# Patient Record
Sex: Female | Born: 1995 | Race: Black or African American | Hispanic: No | Marital: Single | Smoking: Current every day smoker
Health system: Southern US, Community
[De-identification: ages and names within clinical notes are randomized; demographics above are authoritative.]

---
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
## 2017-10-27 ENCOUNTER — Encounter: Payer: Self-pay | Admitting: Emergency Medicine

## 2017-10-27 ENCOUNTER — Emergency Department
Admission: EM | Admit: 2017-10-27 | Discharge: 2017-10-27 | Disposition: A | Discharge status: Home / Self Care | Payer: BLUE CROSS/BLUE SHIELD | Attending: Emergency Medicine | Admitting: Emergency Medicine

## 2017-10-27 ENCOUNTER — Other Ambulatory Visit: Payer: Self-pay

## 2017-10-27 DIAGNOSIS — Z139 Encounter for screening, unspecified: Secondary | ICD-10-CM | POA: Insufficient documentation

## 2017-10-27 DIAGNOSIS — F1010 Alcohol abuse, uncomplicated: Secondary | ICD-10-CM | POA: Diagnosis not present

## 2017-10-27 DIAGNOSIS — Z0289 Encounter for other administrative examinations: Secondary | ICD-10-CM | POA: Diagnosis present

## 2017-10-27 NOTE — ED Provider Notes (Signed)
[facility] Emergency Department Provider Note  ____________________________________________  Time seen: Approximately 7:23 PM  I have reviewed the triage vital signs and the nursing notes.   HISTORY  Chief Complaint Medical Clearance    HPI Maria Byrd is a 22 y.o. female with no significant past medical history who reports being instructed to come to the ED for medical clearance prior to proceeding with alcohol abuse treatment at [facility].  Patient reports that she drinks about half a bottle of liquor a day.  If she does not drink for a few days she feels anxious and flushed and a little sweaty, but denies any history of seizures or hallucinations ever.  Currently she feels fine.  Last drink was 2 days ago.  No acute complaints at this time.  Denies chest pain shortness of breath vision changes or agitation.  Denies smoking or any drug use.      History reviewed. No pertinent past medical history.   There are no active problems to display for this patient.    History reviewed. No pertinent surgical history.   Prior to Admission medications   Medication Sig Start Date End Date Taking? Authorizing Provider  meloxicam (MOBIC) 15 MG tablet Take 1 tablet (15 mg total) by mouth daily. 10/25/16   Cuthriell, Delorise Royals, PA-C     Allergies Patient has no known allergies.   No family history on file.  Social History Social History   Tobacco Use  . Smoking status: Never Smoker  . Smokeless tobacco: Never Used  Substance Use Topics  . Alcohol use: Yes  . Drug use: No    Review of Systems  Constitutional:   No fever or chills.   Cardiovascular:   No chest pain or syncope. Respiratory:   No dyspnea or cough. Gastrointestinal:   Negative for abdominal pain, vomiting and diarrhea.  Musculoskeletal:   Negative for focal pain or swelling All other systems reviewed and are negative except as documented above in ROS and  HPI.  ____________________________________________   PHYSICAL EXAM:  VITAL SIGNS: ED Triage Vitals  Enc Vitals Group     BP 10/27/17 1806 111/70     Pulse Rate 10/27/17 1806 81     Resp 10/27/17 1806 16     Temp 10/27/17 1806 98.8 F (37.1 C)     Temp Source 10/27/17 1806 Oral     SpO2 10/27/17 1806 100 %     Weight 10/27/17 1807 138 lb (62.6 kg)     Height 10/27/17 1807 5\' 3"  (1.6 m)     Head Circumference --      Peak Flow --      Pain Score 10/27/17 1807 0     Pain Loc --      Pain Edu? --      Excl. in GC? --     Vital signs reviewed, nursing assessments reviewed.   Constitutional:   Alert and oriented. Non-toxic appearance. Eyes:   Conjunctivae are normal. EOMI.  ENT      Head:   Normocephalic and atraumatic.      Nose:   No congestion/rhinnorhea.       Mouth/Throat:   MMM, no pharyngeal erythema. No peritonsillar mass.       Neck:   No meningismus. Full ROM. Hematological/Lymphatic/Immunilogical:   No cervical lymphadenopathy. Cardiovascular:   RRR. Symmetric bilateral radial and DP pulses.  No murmurs. Cap refill less than 2 seconds. Respiratory:   Normal respiratory effort without tachypnea/retractions. Breath sounds are  clear and equal bilaterally. No wheezes/rales/rhonchi. Gastrointestinal:   Soft and nontender. Non distended. There is no CVA tenderness.  No rebound, rigidity, or guarding.  Musculoskeletal:   Normal range of motion in all extremities. No joint effusions.  No lower extremity tenderness.  No edema. Neurologic:   Normal speech and language.  Motor grossly intact. No acute focal neurologic deficits are appreciated.  Skin:    Skin is warm and dry ____________________________________________    LABS (pertinent positives/negatives) (all labs ordered are listed, but only abnormal results are displayed) Labs Reviewed - No data to display ____________________________________________   EKG    ____________________________________________     RADIOLOGY  No results found.  ____________________________________________   PROCEDURES Procedures  ____________________________________________    CLINICAL IMPRESSION / ASSESSMENT AND PLAN / ED COURSE  Pertinent labs & imaging results that were available during my care of the patient were reviewed by me and considered in my medical decision making (see chart for details).    Patient with a history of alcohol abuse presents for medical screening.  No acute complaints.  No signs or symptoms of withdrawal at this time.  Vital signs are normal.  Advised that she can proceed with substance abuse treatment.      ____________________________________________   FINAL CLINICAL IMPRESSION(S) / ED DIAGNOSES    Final diagnoses:  Alcohol abuse  Encounter for medical screening examination     ED Discharge Orders    None      Portions of this note were generated with dragon dictation software. Dictation errors may occur despite best attempts at proofreading.    Sharman CheekStafford, Tisa Weisel, MD 10/27/17 [phone]

## 2017-10-27 NOTE — ED Triage Notes (Signed)
Pt to ED via POV. Pt states that she needs medical clearance so that she can go to [facility] for alcohol dextox. Pt states that her last drink was 2 days ago. Pt denies hx/o seizures from alcohol withdrawal. Pt is in NAD at this time.

## 2017-10-27 NOTE — Discharge Instructions (Addendum)
You had no acute complaints today, and your physical exam was unremarkable.  You are medically stable and cleared to proceed with substance abuse evaluation and treatment.

## 2017-10-27 NOTE — ED Notes (Signed)
Pt. Going home with mother in waiting room.

## 2018-12-02 ENCOUNTER — Ambulatory Visit (INDEPENDENT_AMBULATORY_CARE_PROVIDER_SITE_OTHER): Payer: Managed Care, Other (non HMO)

## 2018-12-02 ENCOUNTER — Ambulatory Visit
Admission: EM | Admit: 2018-12-02 | Discharge: 2018-12-02 | Disposition: A | Discharge status: Home / Self Care | Payer: Managed Care, Other (non HMO) | Attending: Family Medicine | Admitting: Family Medicine

## 2018-12-02 DIAGNOSIS — M25561 Pain in right knee: Secondary | ICD-10-CM | POA: Diagnosis not present

## 2018-12-02 DIAGNOSIS — W108XXA Fall (on) (from) other stairs and steps, initial encounter: Secondary | ICD-10-CM

## 2018-12-02 DIAGNOSIS — M25461 Effusion, right knee: Secondary | ICD-10-CM

## 2018-12-02 MED ORDER — MELOXICAM 15 MG PO TABS: 15.0000 mg | ORAL_TABLET | Freq: Every day | ORAL | 0 refills | Status: DC | PRN

## 2018-12-02 NOTE — ED Triage Notes (Signed)
Pt states she fell 2 days ago, missed some stairs and landed on her right knee on the concrete. States it is red, swollen and did take ibuprofen without relief.

## 2018-12-02 NOTE — ED Provider Notes (Signed)
[facility] ____________________________________________  Time seen: Approximately 9:09 AM  I have reviewed the triage vital signs and the nursing notes.   HISTORY  Chief Complaint Fall   HPI Maria Byrd is a 23 y.o. female presenting for evaluation of right knee pain post injury that occurred 2 days ago.  Patient reports she missed the bottom step outside causing her to fall directly on her right knee.  Reports pain and swelling since.  Difficulty walking but able to weight-bear.  Occasionally feels like knee is getting give out on her.  No other injury.  Has tried Aleve and ice without resolution.  Difficulty going to work due to a lot of walking required at work.  No issues to the same area previously.  Denies other aggravating or alleviating factors.  No paresthesias or pain radiation.  No LMP recorded. (Menstrual status: IUD).  Denies pregnancy   History reviewed. No pertinent past medical history. Denies   There are no active problems to display for this patient.   History reviewed. No pertinent surgical history.   No current facility-administered medications for this encounter.   Current Outpatient Medications:  .  meloxicam (MOBIC) 15 MG tablet, Take 1 tablet (15 mg total) by mouth daily as needed., Disp: 10 tablet, Rfl: 0  Allergies Patient has no known allergies.  Family History  Problem Relation Age of Onset  . Healthy Mother   . Healthy Father     Social History Social History   Tobacco Use  . Smoking status: Never Smoker  . Smokeless tobacco: Never Used  Substance Use Topics  . Alcohol use: Yes  . Drug use: No    Review of Systems Constitutional: No fever Cardiovascular: Denies chest pain. Respiratory: Denies shortness of breath. Gastrointestinal: No abdominal pain.   Musculoskeletal: Positive right knee pain. Skin: Negative for rash. Neurological: Negative for headaches.  ____________________________________________    PHYSICAL EXAM:  VITAL SIGNS: ED Triage Vitals  Enc Vitals Group     BP 12/02/18 0828 123/79     Pulse Rate 12/02/18 0828 71     Resp 12/02/18 0828 18     Temp 12/02/18 0828 98.4 F (36.9 C)     Temp Source 12/02/18 0828 Oral     SpO2 12/02/18 0828 100 %     Weight 12/02/18 0830 158 lb (71.7 kg)     Height --      Head Circumference --      Peak Flow --      Pain Score 12/02/18 0830 7     Pain Loc --      Pain Edu? --      Excl. in GC? --     Constitutional: Alert and oriented. Well appearing and in no acute distress. Eyes: Conjunctivae are normal.  ENT      Head: Normocephalic and atraumatic. Cardiovascular: Normal rate, regular rhythm. Grossly normal heart sounds.  Good peripheral circulation. Respiratory: Normal respiratory effort without tachypnea nor retractions. Breath sounds are clear and equal bilaterally. No wheezes, rales, rhonchi. Musculoskeletal: Ambulatory with antalgic gait.  Bilateral pedal pulses equal and easily palpated.   Except: Diffuse anterior knee edema and tenderness, mild pain with anterior and posterior drawer test, no pain with medial and lateral stress, able to fully extend as well as flex but with pain, right lower leg otherwise nontender. Neurologic:  Normal speech and language.  Skin:  Skin is warm, dry and intact. No rash noted. Psychiatric: Mood and affect are normal. Speech and behavior  are normal. Patient exhibits appropriate insight and judgment   ___________________________________________   LABS (all labs ordered are listed, but only abnormal results are displayed)  Labs Reviewed - No data to display ____________________________________________  RADIOLOGY  Dg Knee Complete 4 Views Right  Result Date: 12/02/2018 CLINICAL DATA:  Pain post fall EXAM: RIGHT KNEE - COMPLETE 4+ VIEW COMPARISON:  None. FINDINGS: No fracture or dislocation. There is a moderate knee joint effusion. Normal bone mineralization seen throughout. IMPRESSION:  Moderate knee joint effusion.  No acute osseous abnormality. Electronically Signed   By: Prudencio Pair M.D.   On: 12/02/2018 08:56   ____________________________________________   PROCEDURES Procedures   INITIAL IMPRESSION / ASSESSMENT AND PLAN / ED COURSE  Pertinent labs & imaging results that were available during my care of the patient were reviewed by me and considered in my medical decision making (see chart for details).  Well-appearing patient.  No acute distress.  Right knee pain post injury.  Right knee x-ray as above, moderate knee joint effusion, no acute osseous abnormality.  Suspect contusion, discussed concern for ligamentous injury.  Knee immobilizer and crutches given.  Directed to ice, elevate and will treat with Mobic.  Follow-up with orthopedic this week.  Work note given. Discussed indication, risks and benefits of medications with patient.  Discussed follow up and return parameters including no resolution or any worsening concerns. Patient verbalized understanding and agreed to plan.   ____________________________________________   FINAL CLINICAL IMPRESSION(S) / ED DIAGNOSES  Final diagnoses:  Effusion of right knee  Acute pain of right knee     ED Discharge Orders         Ordered    meloxicam (MOBIC) 15 MG tablet  Daily PRN     12/02/18 0910           Note: This dictation was prepared with Dragon dictation along with smaller phrase technology. Any transcriptional errors that result from this process are unintentional.         Marylene Land, NP 12/02/18 [phone]

## 2018-12-02 NOTE — Discharge Instructions (Signed)
Take medication as prescribed. Rest. Drink plenty of fluids. Ice.Elevate.  Use crutches and brace.  Gradually increase activity as tolerated.  Follow-up with orthopedic as discussed.  Follow up with your primary care physician this week as needed. Return to Urgent care for new or worsening concerns.

## 2019-03-10 ENCOUNTER — Other Ambulatory Visit: Payer: Self-pay

## 2019-03-10 ENCOUNTER — Encounter: Payer: Self-pay | Admitting: Emergency Medicine

## 2019-03-10 ENCOUNTER — Ambulatory Visit
Admission: EM | Admit: 2019-03-10 | Discharge: 2019-03-10 | Disposition: A | Discharge status: Home / Self Care | Payer: Self-pay | Attending: Family Medicine | Admitting: Family Medicine

## 2019-03-10 ENCOUNTER — Ambulatory Visit (INDEPENDENT_AMBULATORY_CARE_PROVIDER_SITE_OTHER): Payer: Self-pay

## 2019-03-10 DIAGNOSIS — M25462 Effusion, left knee: Secondary | ICD-10-CM

## 2019-03-10 DIAGNOSIS — M25562 Pain in left knee: Secondary | ICD-10-CM

## 2019-03-10 MED ORDER — MELOXICAM 15 MG PO TABS: 15.0000 mg | ORAL_TABLET | Freq: Every day | ORAL | 0 refills | Status: AC | PRN

## 2019-03-10 MED ORDER — TRAMADOL HCL 50 MG PO TABS: 50.0000 mg | ORAL_TABLET | Freq: Three times a day (TID) | ORAL | 0 refills | Status: AC | PRN

## 2019-03-10 NOTE — Discharge Instructions (Addendum)
Take medication as prescribed. Rest. Drink plenty of fluids. Ice. Use crutches.   Follow-up with orthopedic soon as possible as discussed.  Follow up with your primary care physician this week as needed. Return to Urgent care for new or worsening concerns.

## 2019-03-10 NOTE — ED Triage Notes (Signed)
Pt c/o left knee pain. She states that she tripped over her sons toy and heard her knee pop. She has swelling of the knee and can not bare weight.
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
## 2020-04-24 ENCOUNTER — Emergency Department: Payer: No Typology Code available for payment source

## 2020-04-24 ENCOUNTER — Encounter: Payer: Self-pay | Admitting: Radiology

## 2020-04-24 ENCOUNTER — Emergency Department
Admission: EM | Admit: 2020-04-24 | Discharge: 2020-04-24 | Disposition: A | Discharge status: Home / Self Care | Payer: No Typology Code available for payment source | Attending: Emergency Medicine | Admitting: Emergency Medicine

## 2020-04-24 ENCOUNTER — Other Ambulatory Visit: Payer: Self-pay

## 2020-04-24 DIAGNOSIS — M545 Low back pain, unspecified: Secondary | ICD-10-CM | POA: Diagnosis not present

## 2020-04-24 DIAGNOSIS — Y92410 Unspecified street and highway as the place of occurrence of the external cause: Secondary | ICD-10-CM | POA: Insufficient documentation

## 2020-04-24 DIAGNOSIS — M546 Pain in thoracic spine: Secondary | ICD-10-CM | POA: Diagnosis not present

## 2020-04-24 NOTE — Discharge Instructions (Signed)
Take Meloxicam and Robaxin as directed.  
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
## 2020-10-28 IMAGING — CR RIGHT KNEE - COMPLETE 4+ VIEW
4 series · 4 of 4 positions shown · non-contrast
Comparison: None.

CLINICAL DATA: Pain post fall

EXAM:
RIGHT KNEE - COMPLETE 4+ VIEW

[knee ap]
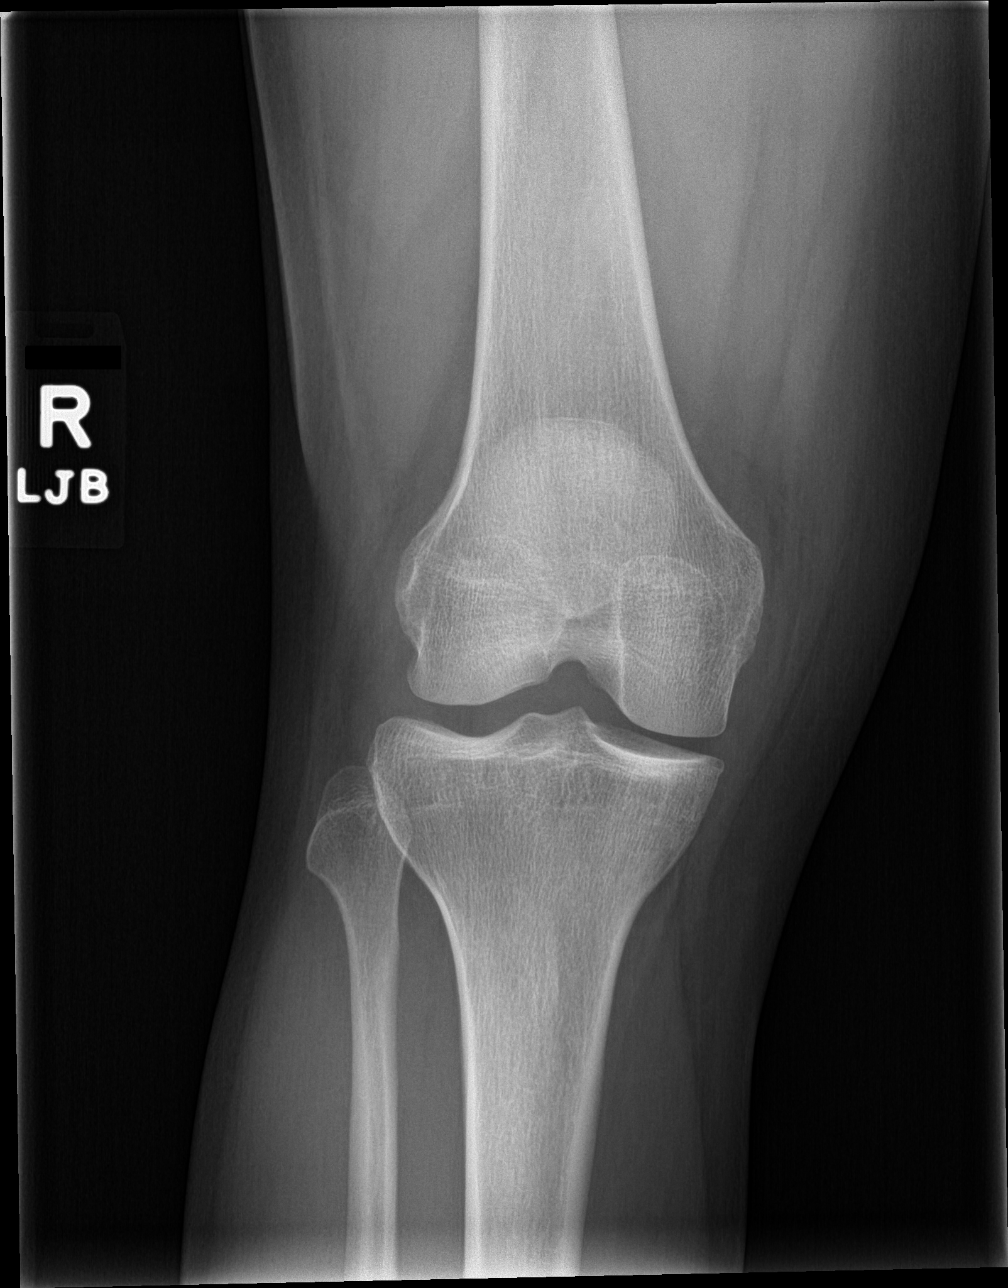

[knee lat]
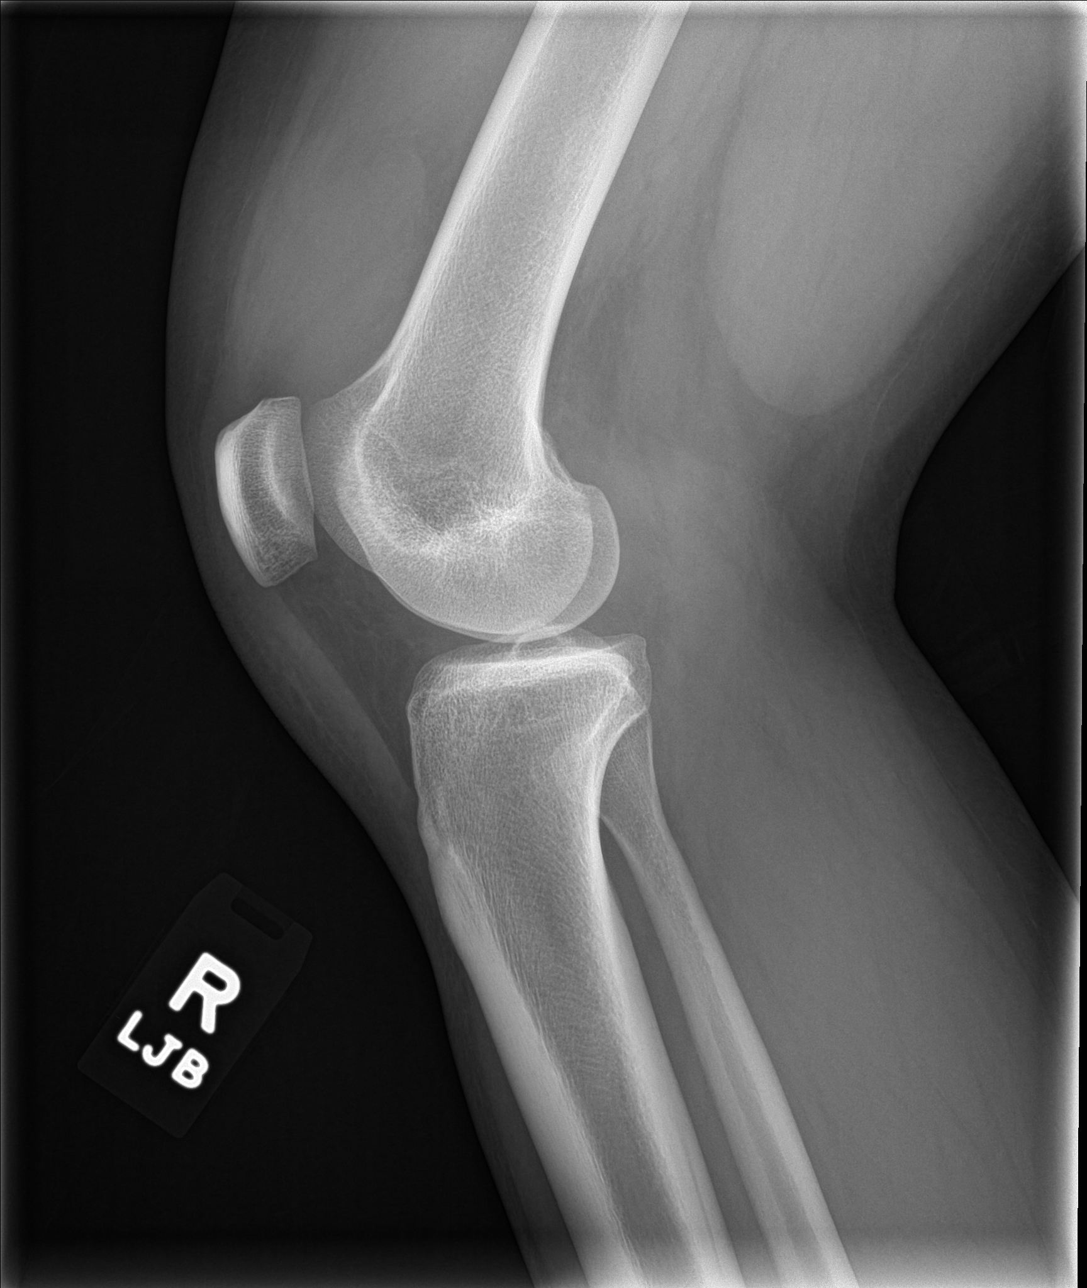

[tunnel]
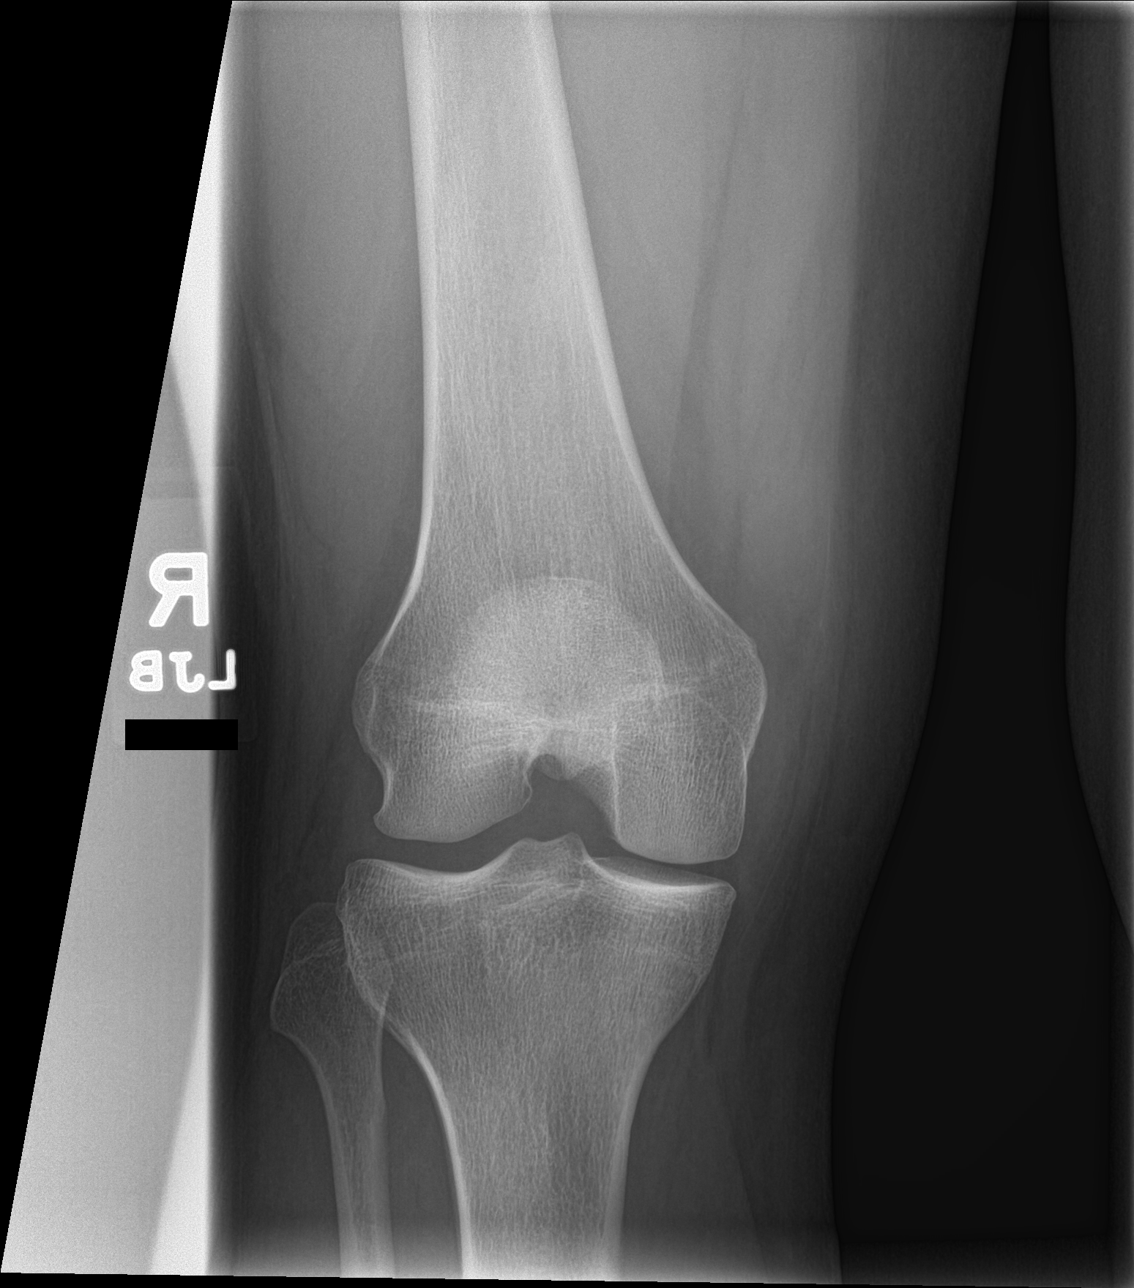

[patella skyline]
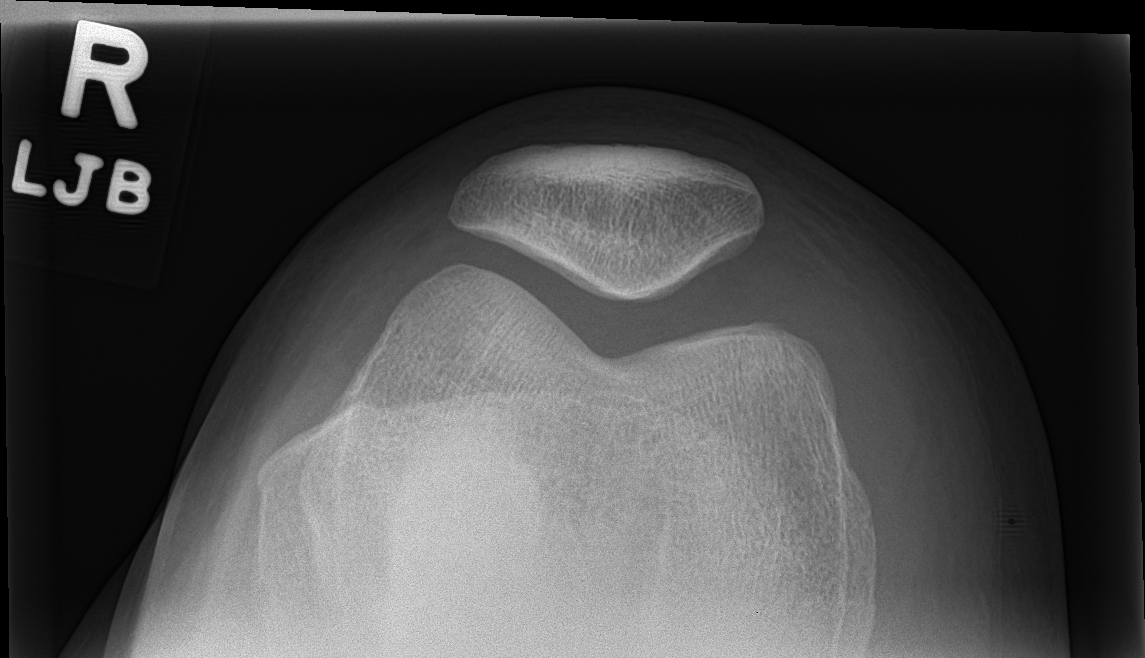

[4 of 4 positions shown; findings below may reference images not displayed]

FINDINGS: No fracture or dislocation. There is a moderate knee joint effusion.
Normal bone mineralization seen throughout.
IMPRESSION: Moderate knee joint effusion.  No acute osseous abnormality.

## 2021-01-26 ENCOUNTER — Ambulatory Visit (LOCAL_COMMUNITY_HEALTH_CENTER): Payer: Self-pay | Admitting: Advanced Practice Midwife

## 2021-01-26 ENCOUNTER — Other Ambulatory Visit: Payer: Self-pay

## 2021-01-26 ENCOUNTER — Encounter: Payer: Self-pay | Admitting: Advanced Practice Midwife

## 2021-01-26 VITALS — BP 104/67 | Ht 64.0 in | Wt 145.4 lb

## 2021-01-26 DIAGNOSIS — Z3049 Encounter for surveillance of other contraceptives: Secondary | ICD-10-CM

## 2021-01-26 DIAGNOSIS — Z3009 Encounter for other general counseling and advice on contraception: Secondary | ICD-10-CM

## 2021-01-26 DIAGNOSIS — Z720 Tobacco use: Secondary | ICD-10-CM | POA: Insufficient documentation

## 2021-01-26 DIAGNOSIS — F1290 Cannabis use, unspecified, uncomplicated: Secondary | ICD-10-CM | POA: Insufficient documentation

## 2021-01-26 LAB — HEMOGLOBIN, FINGERSTICK: Hemoglobin: 13 g/dL (ref 11.1–15.9)

## 2021-01-26 LAB — WET PREP FOR TRICH, YEAST, CLUE
Trichomonas Exam: NEGATIVE
Yeast Exam: NEGATIVE

## 2021-01-26 LAB — HM HIV SCREENING LAB: HM HIV Screening: NEGATIVE

## 2021-01-26 NOTE — Progress Notes (Signed)
[facility] [address] Main Number: [phone]    Family Planning Visit- Initial Visit  Subjective:  Maria Byrd is a 25 y.o. SBF vaper [id number] (5 yo son)  being seen today for an initial annual visit and to discuss contraceptive options.  The patient is currently using IUD Mirena for pregnancy prevention. Patient reports she does not want a pregnancy in the next year.  Patient has the following medical conditions has Vapes nicotine containing substance and Marijuana use on their problem list.  Chief Complaint  Patient presents with   Annual Exam   Contraception    Patient reports here for physical and birth control discussion. Happy with IUD inserted 02/2016 at [facility].  LMP 12/2020. Last sex 12/25/20 without condom; with partner off and on x 4 years; 2 partners in last 3 mo.  Last dental exam 7 mo ago. Last vaped 2 wks ago. Last MJ 6 mo ago. Last ETOH 01/23/21 (2 Margaritas) 3x/mo. 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  Living with her mom, stepdad, stepdad's son, and pt's son.  Patient denies cigs, cigars  Body mass index is 24.96 kg/m. - Patient is eligible for diabetes screening based on BMI and age >60?  not applicable HA1C ordered? not applicable  Patient reports 1  partner/s in last year. Desires STI screening?  Yes  Has patient been screened once for HCV in the past?  No  No results found for: HCVAB  Does the patient have current drug use (including MJ), have a partner with drug use, and/or has been incarcerated since last result? No  If yes-- Screen for HCV through [facility]   Does the patient meet criteria for HBV testing? No  Criteria:  -Household, sexual or needle sharing contact with HBV -History of drug use -HIV positive -Those with known Hep C   Health Maintenance Due  Topic Date Due   COVID-19 Vaccine (1) Never done   HPV VACCINES (1 - 2-dose series)  Never done   HIV Screening  Never done   Hepatitis C Screening  Never done   PAP-Cervical Cytology Screening  Never done   PAP SMEAR-Modifier  Never done   INFLUENZA VACCINE  Never done    Review of Systems  HENT:  Positive for sore throat (onset with covid booster 01/20/21, -covid test recently).    The following portions of the patient's history were reviewed and updated as appropriate: allergies, current medications, past family history, past medical history, past social history, past surgical history and problem list. Problem list updated.   See flowsheet for other program required questions.  Objective:   Vitals:   01/26/21 1045  BP: 104/67  Weight: 145 lb 6.4 oz (66 kg)  Height: 5\' 4"  (1.626 m)    Physical Exam Constitutional:      Appearance: Normal appearance. She is normal weight.  HENT:     Head: Normocephalic and atraumatic.     Comments: Last dental exam 7 mo ago    Mouth/Throat:     Mouth: Mucous membranes are moist.  Eyes:     Conjunctiva/sclera: Conjunctivae normal.  Neck:     Thyroid: No thyroid mass, thyromegaly or thyroid tenderness.  Cardiovascular:     Rate and Rhythm: Normal rate and regular rhythm.  Pulmonary:     Effort: Pulmonary effort is normal.     Breath sounds: Normal breath sounds.  Chest:  Breasts:  Right: Normal.     Left: Normal.     Comments: Pierced left nipple Abdominal:     General: Abdomen is flat.     Palpations: Abdomen is soft.     Comments: Soft without masses or tenderness  Genitourinary:    General: Normal vulva.     Exam position: Lithotomy position.     Vagina: Vaginal discharge (grey creamy leukorrhea, ph>4.5) present.     Cervix: Normal.     Uterus: Normal.      Adnexa: Right adnexa normal and left adnexa normal.     Rectum: Normal.     Comments: Blue IUD strings visualized  Pap done Musculoskeletal:        General: Normal range of motion.     Cervical back: Normal range of motion and neck supple.   Skin:    General: Skin is warm and dry.  Neurological:     Mental Status: She is alert.  Psychiatric:        Mood and Affect: Mood normal.      Assessment and Plan:  Maria Byrd is a 25 y.o. female presenting to the [facility] Department for an initial annual wellness/contraceptive visit  Contraception counseling: Reviewed all forms of birth control options in the tiered based approach. available including abstinence; over the counter/barrier methods; hormonal contraceptive medication including pill, patch, ring, injection,contraceptive implant, ECP; hormonal and nonhormonal IUDs; permanent sterilization options including vasectomy and the various tubal sterilization modalities. Risks, benefits, and typical effectiveness rates were reviewed.  Questions were answered.  Written information was also given to the patient to review.  Patient desires continuation of IUD, this was prescribed for patient. She will follow up in  prn for surveillance.  She was told to call with any further questions, or with any concerns about this method of contraception.  Emphasized use of condoms 100% of the time for STI prevention.  Patient was offered ECP. ECP was not accepted by the patient. ECP counseling was not given - see RN documentation  1. Family planning ROI for type of IUD inserted at [facility] 02/2016; if Mirena then counseled good for 7 years; pt ok with leaving in if Mirena still effective Please give primary care MD list to pt Treat wet mount per standing orders Immunization nurse consult - HIV [place] [facility] - Syphilis Serology, [place] [facility] - Chlamydia/Gonorrhea [place] [facility] - Pap IG (Image Guided) - WET PREP FOR TRICH, YEAST, CLUE - Hemoglobin, venipuncture  2. Vapes nicotine containing substance Counseled via 5 A's to stop   3. Marijuana use      No follow-ups on file.  No future appointments.  Alberteen Spindle, CNM

## 2021-01-26 NOTE — Progress Notes (Signed)
Wet mount reviewed, no treatment indicated. Hgb= 13.0, no treatment indicated. Burt Knack, RN

## 2021-01-26 NOTE — Progress Notes (Addendum)
Patient here for PE and wants to discuss BCM. She states she currently has a 5 year IUD inserted in 02/2016 in [place]. Concerned that IUD may stop working and is considering switching to Depo at time of IUD removal.No pap on record.Marland KitchenMarland KitchenBurt Knack, RN

## 2021-01-31 LAB — PAP IG (IMAGE GUIDED): PAP Smear Comment: 0
